# Patient Record
Sex: Female | Born: 1960 | Race: Black or African American | Hispanic: No | Marital: Single | Smoking: Never smoker
Health system: Southern US, Community
[De-identification: ages and names within clinical notes are randomized; demographics above are authoritative.]

## PROBLEM LIST (undated history)

## (undated) HISTORY — PX: BREAST EXCISIONAL BIOPSY: SUR124

---

## 2009-09-15 ENCOUNTER — Ambulatory Visit (HOSPITAL_COMMUNITY): Admission: RE | Admit: 2009-09-15 | Discharge: 2009-09-15 | Payer: Self-pay | Admitting: Internal Medicine

## 2010-08-24 ENCOUNTER — Other Ambulatory Visit (HOSPITAL_COMMUNITY): Payer: Self-pay | Admitting: Internal Medicine

## 2010-08-24 DIAGNOSIS — Z1231 Encounter for screening mammogram for malignant neoplasm of breast: Secondary | ICD-10-CM

## 2010-09-20 ENCOUNTER — Ambulatory Visit (HOSPITAL_COMMUNITY)
Admission: RE | Admit: 2010-09-20 | Discharge: 2010-09-20 | Disposition: A | Discharge status: Home / Self Care | Payer: BC Managed Care – PPO | Source: Ambulatory Visit | Attending: Internal Medicine | Admitting: Internal Medicine

## 2010-09-20 DIAGNOSIS — Z1231 Encounter for screening mammogram for malignant neoplasm of breast: Secondary | ICD-10-CM | POA: Insufficient documentation

## 2010-09-30 ENCOUNTER — Other Ambulatory Visit (HOSPITAL_COMMUNITY)
Admission: RE | Admit: 2010-09-30 | Discharge: 2010-09-30 | Disposition: A | Discharge status: Home / Self Care | Payer: BC Managed Care – PPO | Source: Ambulatory Visit | Attending: Internal Medicine | Admitting: Internal Medicine

## 2010-09-30 ENCOUNTER — Other Ambulatory Visit: Payer: Self-pay | Admitting: Internal Medicine

## 2010-09-30 DIAGNOSIS — Z01419 Encounter for gynecological examination (general) (routine) without abnormal findings: Secondary | ICD-10-CM | POA: Insufficient documentation

## 2011-08-02 ENCOUNTER — Other Ambulatory Visit: Payer: Self-pay | Admitting: Physician Assistant

## 2011-08-02 DIAGNOSIS — D229 Melanocytic nevi, unspecified: Secondary | ICD-10-CM

## 2011-08-02 HISTORY — DX: Melanocytic nevi, unspecified: D22.9

## 2012-09-14 ENCOUNTER — Ambulatory Visit
Admission: RE | Admit: 2012-09-14 | Discharge: 2012-09-14 | Disposition: A | Discharge status: Home / Self Care | Payer: BC Managed Care – PPO | Source: Ambulatory Visit | Attending: Internal Medicine | Admitting: Internal Medicine

## 2012-09-14 ENCOUNTER — Other Ambulatory Visit: Payer: Self-pay | Admitting: Internal Medicine

## 2012-09-14 DIAGNOSIS — M533 Sacrococcygeal disorders, not elsewhere classified: Secondary | ICD-10-CM

## 2012-10-29 ENCOUNTER — Other Ambulatory Visit (HOSPITAL_COMMUNITY): Payer: Self-pay | Admitting: Internal Medicine

## 2012-10-29 DIAGNOSIS — Z1231 Encounter for screening mammogram for malignant neoplasm of breast: Secondary | ICD-10-CM

## 2012-11-13 ENCOUNTER — Ambulatory Visit (HOSPITAL_COMMUNITY)
Admission: RE | Admit: 2012-11-13 | Discharge: 2012-11-13 | Disposition: A | Discharge status: Home / Self Care | Payer: BC Managed Care – PPO | Source: Ambulatory Visit | Attending: Internal Medicine | Admitting: Internal Medicine

## 2012-11-13 DIAGNOSIS — Z1231 Encounter for screening mammogram for malignant neoplasm of breast: Secondary | ICD-10-CM | POA: Insufficient documentation

## 2014-12-10 ENCOUNTER — Other Ambulatory Visit (HOSPITAL_COMMUNITY): Payer: Self-pay | Admitting: Internal Medicine

## 2014-12-10 DIAGNOSIS — Z1231 Encounter for screening mammogram for malignant neoplasm of breast: Secondary | ICD-10-CM

## 2014-12-23 ENCOUNTER — Other Ambulatory Visit (HOSPITAL_COMMUNITY): Payer: Self-pay | Admitting: Internal Medicine

## 2014-12-23 ENCOUNTER — Ambulatory Visit (HOSPITAL_COMMUNITY)
Admission: RE | Admit: 2014-12-23 | Discharge: 2014-12-23 | Disposition: A | Discharge status: Home / Self Care | Payer: 59 | Source: Ambulatory Visit | Attending: Internal Medicine | Admitting: Internal Medicine

## 2014-12-23 DIAGNOSIS — Z1231 Encounter for screening mammogram for malignant neoplasm of breast: Secondary | ICD-10-CM | POA: Diagnosis not present

## 2015-01-30 ENCOUNTER — Other Ambulatory Visit: Payer: Self-pay | Admitting: Physician Assistant

## 2016-02-16 ENCOUNTER — Other Ambulatory Visit: Payer: Self-pay | Admitting: Internal Medicine

## 2016-02-16 DIAGNOSIS — Z1231 Encounter for screening mammogram for malignant neoplasm of breast: Secondary | ICD-10-CM

## 2016-02-24 ENCOUNTER — Ambulatory Visit
Admission: RE | Admit: 2016-02-24 | Discharge: 2016-02-24 | Disposition: A | Discharge status: Home / Self Care | Payer: 59 | Source: Ambulatory Visit | Attending: Internal Medicine | Admitting: Internal Medicine

## 2016-02-24 ENCOUNTER — Other Ambulatory Visit: Payer: Self-pay | Admitting: Internal Medicine

## 2016-02-24 DIAGNOSIS — Z1231 Encounter for screening mammogram for malignant neoplasm of breast: Secondary | ICD-10-CM

## 2019-03-29 ENCOUNTER — Other Ambulatory Visit: Payer: Self-pay | Admitting: Internal Medicine

## 2019-03-29 DIAGNOSIS — Z1231 Encounter for screening mammogram for malignant neoplasm of breast: Secondary | ICD-10-CM

## 2019-05-22 ENCOUNTER — Ambulatory Visit
Admission: RE | Admit: 2019-05-22 | Discharge: 2019-05-22 | Disposition: A | Discharge status: Home / Self Care | Payer: 59 | Source: Ambulatory Visit | Attending: Internal Medicine | Admitting: Internal Medicine

## 2019-05-22 ENCOUNTER — Other Ambulatory Visit: Payer: Self-pay

## 2019-05-22 DIAGNOSIS — Z1231 Encounter for screening mammogram for malignant neoplasm of breast: Secondary | ICD-10-CM

## 2020-03-24 ENCOUNTER — Other Ambulatory Visit: Payer: Self-pay | Admitting: Internal Medicine

## 2020-03-24 DIAGNOSIS — Z1231 Encounter for screening mammogram for malignant neoplasm of breast: Secondary | ICD-10-CM

## 2020-04-17 ENCOUNTER — Other Ambulatory Visit: Payer: Self-pay | Admitting: Internal Medicine

## 2020-04-17 DIAGNOSIS — R102 Pelvic and perineal pain: Secondary | ICD-10-CM

## 2020-04-20 ENCOUNTER — Ambulatory Visit
Admission: RE | Admit: 2020-04-20 | Discharge: 2020-04-20 | Disposition: A | Discharge status: Home / Self Care | Payer: 59 | Source: Ambulatory Visit | Attending: Internal Medicine | Admitting: Internal Medicine

## 2020-04-20 DIAGNOSIS — R102 Pelvic and perineal pain: Secondary | ICD-10-CM

## 2020-05-25 ENCOUNTER — Ambulatory Visit
Admission: RE | Admit: 2020-05-25 | Discharge: 2020-05-25 | Disposition: A | Discharge status: Home / Self Care | Payer: 59 | Source: Ambulatory Visit | Attending: Internal Medicine | Admitting: Internal Medicine

## 2020-05-25 ENCOUNTER — Other Ambulatory Visit: Payer: Self-pay

## 2020-05-25 DIAGNOSIS — Z1231 Encounter for screening mammogram for malignant neoplasm of breast: Secondary | ICD-10-CM

## 2020-09-01 ENCOUNTER — Ambulatory Visit (INDEPENDENT_AMBULATORY_CARE_PROVIDER_SITE_OTHER): Payer: 59 | Admitting: Physician Assistant

## 2020-09-01 ENCOUNTER — Encounter: Payer: Self-pay | Admitting: Physician Assistant

## 2020-09-01 ENCOUNTER — Other Ambulatory Visit: Payer: Self-pay

## 2020-09-01 DIAGNOSIS — D239 Other benign neoplasm of skin, unspecified: Secondary | ICD-10-CM

## 2020-09-01 DIAGNOSIS — D2361 Other benign neoplasm of skin of right upper limb, including shoulder: Secondary | ICD-10-CM

## 2020-09-01 DIAGNOSIS — Z1283 Encounter for screening for malignant neoplasm of skin: Secondary | ICD-10-CM | POA: Diagnosis not present

## 2020-09-01 NOTE — Progress Notes (Signed)
   Follow-Up Visit   Subjective  Jenny Villarreal is an AA,  60 y.o. female who presents for the following: Annual Exam (Here for full body skin examination- no new concerns. No known history of melanoma or non mole skin cancers. 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   The following portions of the chart were reviewed this encounter and updated as appropriate:  Tobacco  Allergies  Meds  Problems  Med Hx  Surg Hx  Fam Hx      Objective  Well appearing patient in no apparent distress; mood and affect are within normal limits.  A full examination was performed including scalp, head, eyes, ears, nose, lips, neck, chest, axillae, abdomen, back, buttocks, bilateral upper extremities, bilateral lower extremities, hands, feet, fingers, toes, fingernails, and toenails. All findings within normal limits unless otherwise noted below.  Objective  Left Upper Arm - Anterior: Dark dense nodule.   Assessment & Plan  Dermatofibroma Left Upper Arm - Anterior  observe    Skin cancer screening performed today.    I, Tine Mabee, PA-C, have reviewed all documentation's for this visit.  The documentation on 09/01/20 for the exam, diagnosis, procedures and orders are all accurate and complete.

## 2020-10-23 ENCOUNTER — Other Ambulatory Visit: Payer: Self-pay | Admitting: Internal Medicine

## 2020-10-23 DIAGNOSIS — E785 Hyperlipidemia, unspecified: Secondary | ICD-10-CM

## 2020-11-11 ENCOUNTER — Ambulatory Visit (HOSPITAL_COMMUNITY)
Admission: RE | Admit: 2020-11-11 | Discharge: 2020-11-11 | Disposition: A | Discharge status: Home / Self Care | Payer: 59 | Source: Ambulatory Visit | Attending: Internal Medicine | Admitting: Internal Medicine

## 2020-11-11 ENCOUNTER — Other Ambulatory Visit: Payer: Self-pay

## 2020-11-11 DIAGNOSIS — E785 Hyperlipidemia, unspecified: Secondary | ICD-10-CM | POA: Insufficient documentation

## 2021-04-01 ENCOUNTER — Other Ambulatory Visit: Payer: Self-pay | Admitting: Internal Medicine

## 2021-04-01 DIAGNOSIS — Z1231 Encounter for screening mammogram for malignant neoplasm of breast: Secondary | ICD-10-CM

## 2021-05-27 ENCOUNTER — Ambulatory Visit: Payer: 59

## 2021-06-16 ENCOUNTER — Ambulatory Visit
Admission: RE | Admit: 2021-06-16 | Discharge: 2021-06-16 | Disposition: A | Discharge status: Home / Self Care | Payer: PRIVATE HEALTH INSURANCE | Source: Ambulatory Visit | Attending: Internal Medicine | Admitting: Internal Medicine

## 2021-06-16 DIAGNOSIS — Z1231 Encounter for screening mammogram for malignant neoplasm of breast: Secondary | ICD-10-CM

## 2021-06-17 ENCOUNTER — Other Ambulatory Visit: Payer: Self-pay | Admitting: Internal Medicine

## 2021-06-17 DIAGNOSIS — R928 Other abnormal and inconclusive findings on diagnostic imaging of breast: Secondary | ICD-10-CM

## 2021-07-03 ENCOUNTER — Other Ambulatory Visit: Payer: Self-pay | Admitting: Internal Medicine

## 2021-07-03 ENCOUNTER — Ambulatory Visit
Admission: RE | Admit: 2021-07-03 | Discharge: 2021-07-03 | Disposition: A | Discharge status: Home / Self Care | Payer: PRIVATE HEALTH INSURANCE | Source: Ambulatory Visit | Attending: Internal Medicine | Admitting: Internal Medicine

## 2021-07-03 DIAGNOSIS — R928 Other abnormal and inconclusive findings on diagnostic imaging of breast: Secondary | ICD-10-CM

## 2021-07-03 DIAGNOSIS — R921 Mammographic calcification found on diagnostic imaging of breast: Secondary | ICD-10-CM

## 2021-07-15 ENCOUNTER — Ambulatory Visit
Admission: RE | Admit: 2021-07-15 | Discharge: 2021-07-15 | Disposition: A | Discharge status: Home / Self Care | Payer: PRIVATE HEALTH INSURANCE | Source: Ambulatory Visit | Attending: Internal Medicine | Admitting: Internal Medicine

## 2021-07-15 ENCOUNTER — Other Ambulatory Visit: Payer: Self-pay | Admitting: Internal Medicine

## 2021-07-15 DIAGNOSIS — R921 Mammographic calcification found on diagnostic imaging of breast: Secondary | ICD-10-CM

## 2021-07-15 HISTORY — PX: BREAST BIOPSY: SHX20

## 2021-09-01 ENCOUNTER — Ambulatory Visit: Payer: 59 | Admitting: Physician Assistant

## 2021-09-14 ENCOUNTER — Ambulatory Visit (INDEPENDENT_AMBULATORY_CARE_PROVIDER_SITE_OTHER): Payer: PRIVATE HEALTH INSURANCE | Admitting: Physician Assistant

## 2021-09-14 ENCOUNTER — Encounter: Payer: Self-pay | Admitting: Physician Assistant

## 2021-09-14 DIAGNOSIS — Z86018 Personal history of other benign neoplasm: Secondary | ICD-10-CM

## 2021-09-14 DIAGNOSIS — Z1283 Encounter for screening for malignant neoplasm of skin: Secondary | ICD-10-CM

## 2021-09-14 DIAGNOSIS — L820 Inflamed seborrheic keratosis: Secondary | ICD-10-CM

## 2021-09-14 NOTE — Progress Notes (Signed)
? ?  Follow-Up Visit ?  ?Subjective  ?Jenny Villarreal is a 61 y.o. female who presents for the following: Annual Exam (Patient here today for yearly skin check. Per patient she has a few lesions on her right upper arm that she would like checked x unsure no bleeding, no pain, no itching. Patient would also like lesions on her neck that she would like removed x years no bleeding, no pain, no itching. Personal history of atypical mole. No personal history or family history of melanoma or non mole skin cancer. ). ? ? ?The following portions of the chart were reviewed this encounter and updated as appropriate:  Tobacco  Allergies  Meds  Problems  Med Hx  Surg Hx  Fam Hx   ?  ? ?Objective  ?Well appearing patient in no apparent distress; mood and affect are within normal limits. ? ?A full examination was performed including scalp, head, eyes, ears, nose, lips, neck, chest, axillae, abdomen, back, buttocks, bilateral upper extremities, bilateral lower extremities, hands, feet, fingers, toes, fingernails, and toenails. All findings within normal limits unless otherwise noted below. ? ?Neck - Anterior (10) ?Stuck-on crusted plaques.  ? ? ?Assessment & Plan  ?Inflamed seborrheic keratosis (10) ?Neck - Anterior ? ?Destruction of lesion - Neck - Anterior ?Complexity: simple   ?Destruction method: electrodesiccation and curettage   ?Informed consent: discussed and consent obtained   ?Timeout:  patient name, date of birth, surgical site, and procedure verified ?  Curettage performed in three different directions: No   ?Electrodesiccation performed over the curetted area: Yes   ?Hemostasis achieved with:  aluminum chloride and electrodesiccation ?Outcome: patient tolerated procedure well with no complications   ?Post-procedure details: wound care instructions given   ? ? ? ?I, Jeweliana Dudgeon, PA-C, have reviewed all documentation's for this visit.  The documentation on 09/14/21 for the exam, diagnosis, procedures and  orders are all accurate and complete. ?

## 2022-01-10 ENCOUNTER — Other Ambulatory Visit: Payer: Self-pay | Admitting: General Surgery

## 2022-01-10 DIAGNOSIS — Z803 Family history of malignant neoplasm of breast: Secondary | ICD-10-CM

## 2022-01-10 DIAGNOSIS — Z1239 Encounter for other screening for malignant neoplasm of breast: Secondary | ICD-10-CM

## 2022-01-24 ENCOUNTER — Ambulatory Visit
Admission: RE | Admit: 2022-01-24 | Discharge: 2022-01-24 | Disposition: A | Discharge status: Home / Self Care | Payer: PRIVATE HEALTH INSURANCE | Source: Ambulatory Visit | Attending: General Surgery | Admitting: General Surgery

## 2022-01-24 DIAGNOSIS — Z1239 Encounter for other screening for malignant neoplasm of breast: Secondary | ICD-10-CM

## 2022-01-24 DIAGNOSIS — Z803 Family history of malignant neoplasm of breast: Secondary | ICD-10-CM

## 2022-01-24 MED ORDER — GADOBUTROL 1 MMOL/ML IV SOLN
9.0000 mL | Freq: Once | INTRAVENOUS | Status: AC | PRN
  Administered 2022-01-24: 9 mL via INTRAVENOUS

## 2022-01-26 ENCOUNTER — Other Ambulatory Visit: Payer: Self-pay | Admitting: Internal Medicine

## 2022-01-26 ENCOUNTER — Ambulatory Visit
Admission: RE | Admit: 2022-01-26 | Discharge: 2022-01-26 | Disposition: A | Discharge status: Home / Self Care | Payer: PRIVATE HEALTH INSURANCE | Source: Ambulatory Visit | Attending: Internal Medicine | Admitting: Internal Medicine

## 2022-01-26 DIAGNOSIS — R0600 Dyspnea, unspecified: Secondary | ICD-10-CM

## 2022-05-18 ENCOUNTER — Other Ambulatory Visit: Payer: Self-pay | Admitting: Internal Medicine

## 2022-05-18 DIAGNOSIS — Z1231 Encounter for screening mammogram for malignant neoplasm of breast: Secondary | ICD-10-CM

## 2022-06-07 ENCOUNTER — Other Ambulatory Visit: Payer: Self-pay | Admitting: Internal Medicine

## 2022-06-07 DIAGNOSIS — E2839 Other primary ovarian failure: Secondary | ICD-10-CM

## 2022-07-12 ENCOUNTER — Ambulatory Visit
Admission: RE | Admit: 2022-07-12 | Discharge: 2022-07-12 | Disposition: A | Discharge status: Home / Self Care | Payer: PRIVATE HEALTH INSURANCE | Source: Ambulatory Visit | Attending: Internal Medicine | Admitting: Internal Medicine

## 2022-07-12 DIAGNOSIS — Z1231 Encounter for screening mammogram for malignant neoplasm of breast: Secondary | ICD-10-CM

## 2022-07-14 ENCOUNTER — Ambulatory Visit
Admission: RE | Admit: 2022-07-14 | Discharge: 2022-07-14 | Disposition: A | Discharge status: Home / Self Care | Payer: PRIVATE HEALTH INSURANCE | Source: Ambulatory Visit | Attending: Internal Medicine | Admitting: Internal Medicine

## 2022-07-14 DIAGNOSIS — E2839 Other primary ovarian failure: Secondary | ICD-10-CM

## 2022-09-15 ENCOUNTER — Ambulatory Visit: Payer: PRIVATE HEALTH INSURANCE | Admitting: Physician Assistant

## 2023-01-06 ENCOUNTER — Other Ambulatory Visit: Payer: Self-pay | Admitting: General Surgery

## 2023-01-06 DIAGNOSIS — Z803 Family history of malignant neoplasm of breast: Secondary | ICD-10-CM

## 2023-01-06 DIAGNOSIS — Z1239 Encounter for other screening for malignant neoplasm of breast: Secondary | ICD-10-CM

## 2023-02-18 ENCOUNTER — Ambulatory Visit
Admission: RE | Admit: 2023-02-18 | Discharge: 2023-02-18 | Disposition: A | Discharge status: Home / Self Care | Payer: PRIVATE HEALTH INSURANCE | Source: Ambulatory Visit | Attending: General Surgery | Admitting: General Surgery

## 2023-02-18 DIAGNOSIS — Z1239 Encounter for other screening for malignant neoplasm of breast: Secondary | ICD-10-CM

## 2023-02-18 DIAGNOSIS — Z803 Family history of malignant neoplasm of breast: Secondary | ICD-10-CM

## 2023-02-18 MED ORDER — GADOPICLENOL 0.5 MMOL/ML IV SOLN
9.0000 mL | Freq: Once | INTRAVENOUS | Status: AC | PRN
  Administered 2023-02-18: 9 mL via INTRAVENOUS

## 2023-06-07 ENCOUNTER — Other Ambulatory Visit: Payer: Self-pay | Admitting: Internal Medicine

## 2023-06-07 DIAGNOSIS — Z1231 Encounter for screening mammogram for malignant neoplasm of breast: Secondary | ICD-10-CM

## 2023-06-21 ENCOUNTER — Other Ambulatory Visit: Payer: Self-pay | Admitting: Internal Medicine

## 2023-06-21 DIAGNOSIS — E2839 Other primary ovarian failure: Secondary | ICD-10-CM

## 2023-07-18 ENCOUNTER — Ambulatory Visit
Admission: RE | Admit: 2023-07-18 | Discharge: 2023-07-18 | Disposition: A | Discharge status: Home / Self Care | Payer: PRIVATE HEALTH INSURANCE | Source: Ambulatory Visit | Attending: Internal Medicine | Admitting: Internal Medicine

## 2023-07-18 DIAGNOSIS — Z1231 Encounter for screening mammogram for malignant neoplasm of breast: Secondary | ICD-10-CM

## 2023-07-20 IMAGING — MG MM BREAST BX W/ LOC DEV 1ST LESION IMAGE BX SPEC STEREO GUIDE*R*
7 of 12 series · 7 of 20 positions shown · non-contrast
Comparison: Previous exams.
COMPARISON: Previous exams.

Addendum:
CLINICAL DATA: Screening detected indeterminate 3 mm group
calcifications involving the inner RIGHT breast at middle depth,
localizing to the LOWER INNER QUADRANT.

EXAM:
RIGHT BREAST STEREOTACTIC CORE NEEDLE BIOPSY

[R (1 of 7)]
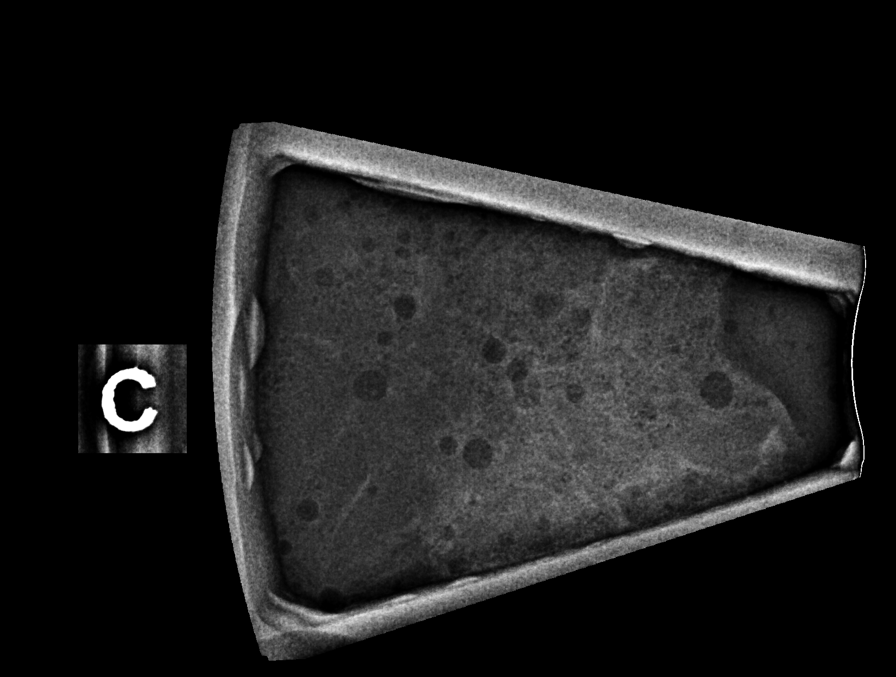

[R (2 of 7)]
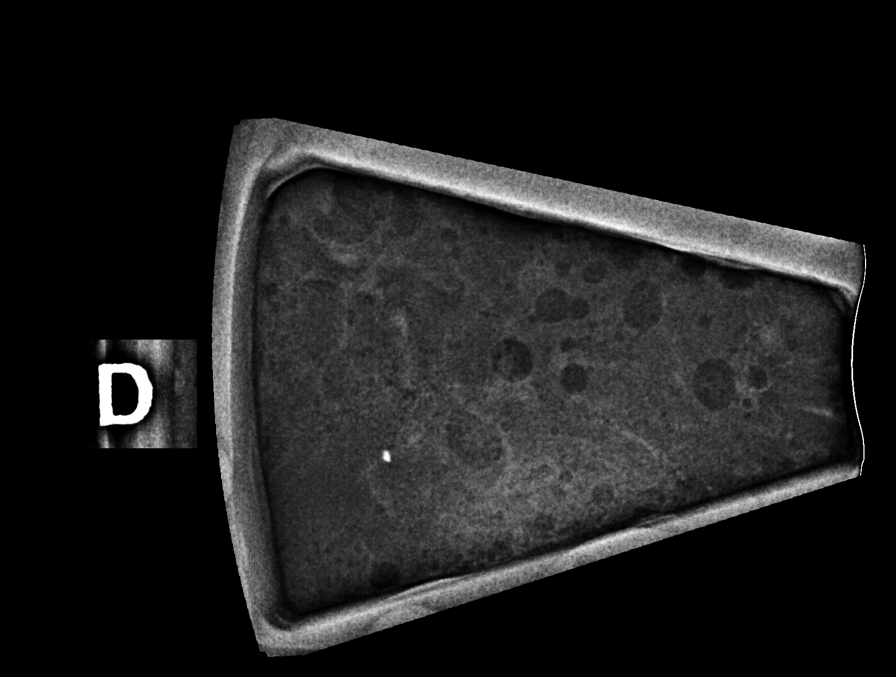

[R (3 of 7)]
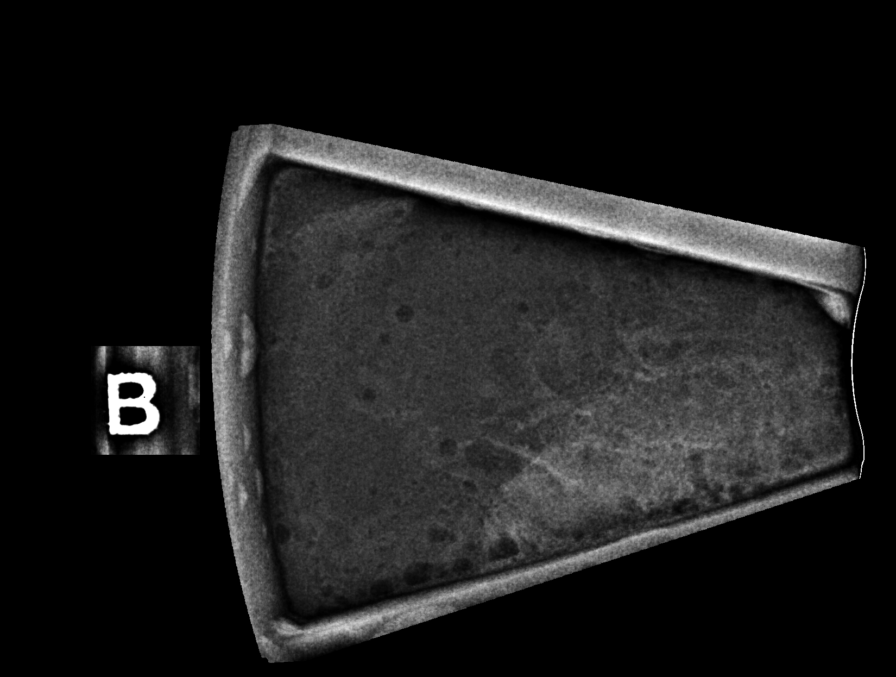

[R (4 of 7)]
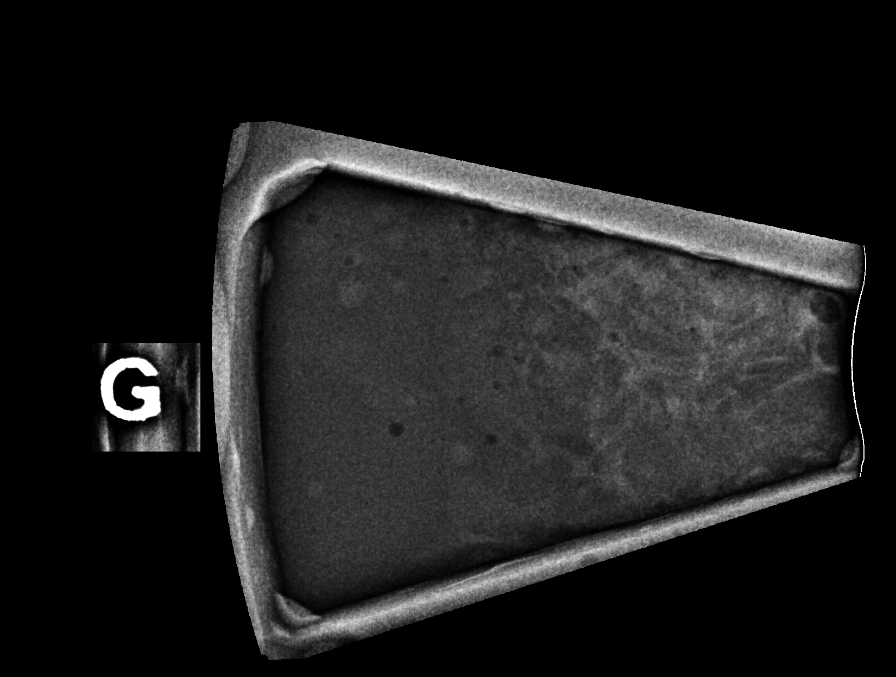

[R (5 of 7)]
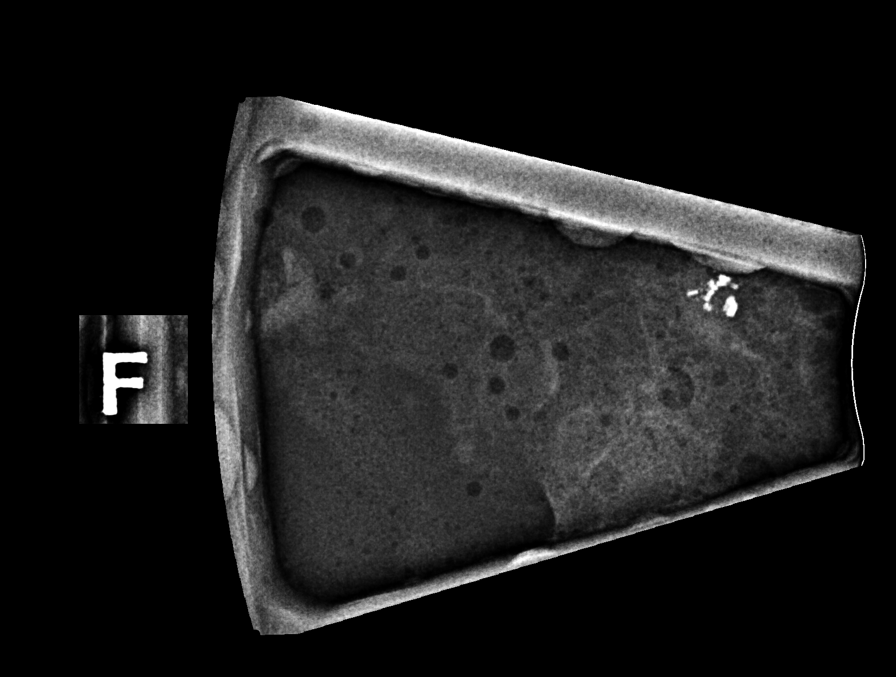

[R (6 of 7)]
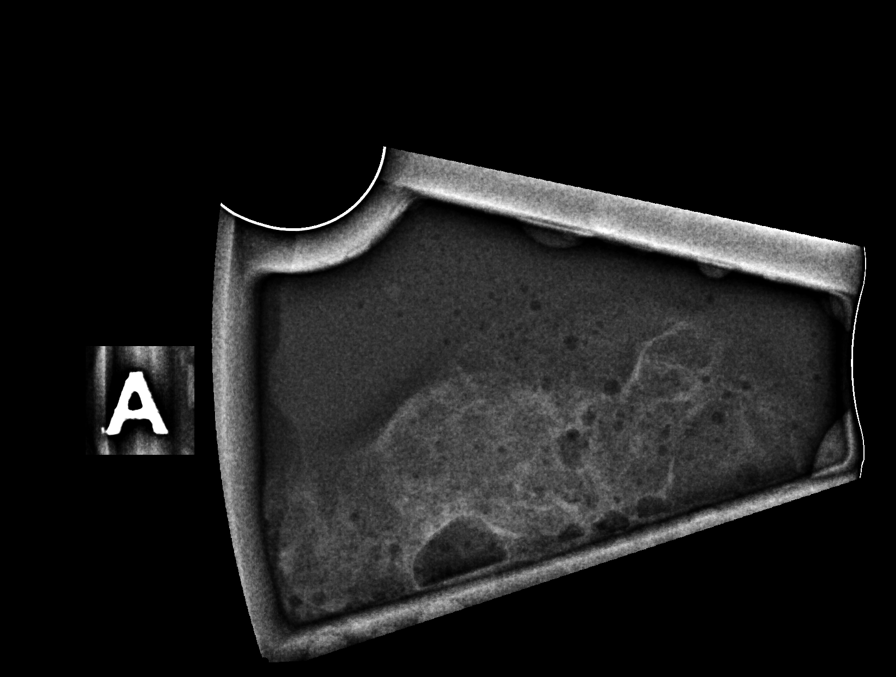

[R (7 of 7)]
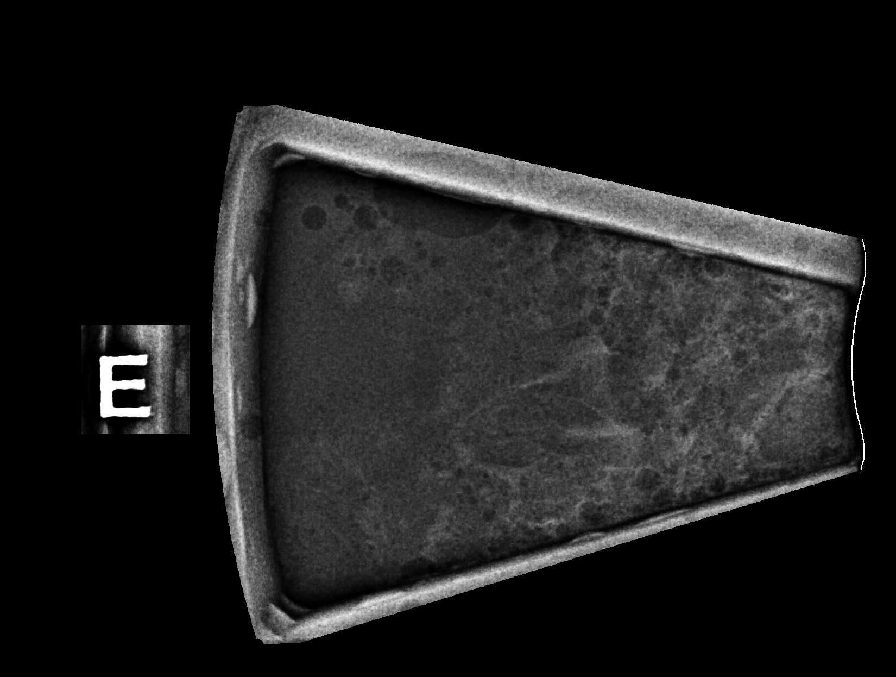

[7 of 20 positions shown; findings below may reference images not displayed]



Lesion quadrant: LOWER INNER QUADRANT.

Using sterile technique with chlorhexidine as skin antisepsis, 1%
lidocaine and 1% lidocaine with epinephrine as local anesthetic,
under stereotactic tomosynthesis guidance, a 9 gauge Brevera vacuum
assisted device was used to perform core needle biopsy of
calcifications involving the inner breast using a superior approach.
Specimen radiograph was performed showing calcifications in 2 of the
core samples. Specimens with calcifications are identified for
pathology. At the conclusion of the procedure, a coil shaped tissue
marker clip was deployed into the biopsy cavity.

Follow-up full field 2D and 3D CC and mediolateral mammographic
images were obtained to confirm clip placement. The coil shaped
tissue marking clip is appropriately positioned at the site of the
biopsied calcifications in the LOWER INNER QUADRANT at middle depth.
All of the calcifications were removed with the biopsy. Expected
post biopsy changes are present without evidence of hematoma.
IMPRESSION: 1. Stereotactic tomosynthesis core needle biopsy of a 3 mm group of
calcifications involving the inner RIGHT breast at middle depth,
localizing to the LOWER INNER QUADRANT. No apparent complications.
2. Appropriate positioning of the coil shaped tissue marker clip at
the site of the biopsied calcifications. All of the calcifications
were removed with the biopsy.

ADDENDUM:
Pathology revealed SCLEROTIC FIBROADENOMATOID NODULE WITH DYSTROPHIC
CALCIFICATIONS- NO MALIGNANCY IDENTIFIED of the RIGHT breast, inner
(coil clip). This was found to be concordant by Dr. Beljean Sordar.

Pathology results were discussed with the patient by telephone. The
patient reported doing well after the biopsy with tenderness at the
site. Post biopsy instructions and care were reviewed and questions
were answered. The patient was encouraged to call The [REDACTED]

The patient was instructed to return for annual screening
mammography and was informed a reminder notice would be sent
regarding this appointment.

Pathology results reported by Sangbeom Interiano RN on 07/16/2021.



Lesion quadrant: LOWER INNER QUADRANT.

Using sterile technique with chlorhexidine as skin antisepsis, 1%
lidocaine and 1% lidocaine with epinephrine as local anesthetic,
under stereotactic tomosynthesis guidance, a 9 gauge Brevera vacuum
assisted device was used to perform core needle biopsy of
calcifications involving the inner breast using a superior approach.
Specimen radiograph was performed showing calcifications in 2 of the
core samples. Specimens with calcifications are identified for
pathology. At the conclusion of the procedure, a coil shaped tissue
marker clip was deployed into the biopsy cavity.

Follow-up full field 2D and 3D CC and mediolateral mammographic
images were obtained to confirm clip placement. The coil shaped
tissue marking clip is appropriately positioned at the site of the
biopsied calcifications in the LOWER INNER QUADRANT at middle depth.
All of the calcifications were removed with the biopsy. Expected
post biopsy changes are present without evidence of hematoma.
IMPRESSION: 1. Stereotactic tomosynthesis core needle biopsy of a 3 mm group of
calcifications involving the inner RIGHT breast at middle depth,
localizing to the LOWER INNER QUADRANT. No apparent complications.
2. Appropriate positioning of the coil shaped tissue marker clip at
the site of the biopsied calcifications. All of the calcifications
were removed with the biopsy.

## 2023-12-05 ENCOUNTER — Other Ambulatory Visit: Payer: Self-pay | Admitting: General Surgery

## 2023-12-05 DIAGNOSIS — Z803 Family history of malignant neoplasm of breast: Secondary | ICD-10-CM

## 2023-12-30 ENCOUNTER — Ambulatory Visit
Admission: RE | Admit: 2023-12-30 | Discharge: 2023-12-30 | Disposition: A | Discharge status: Home / Self Care | Payer: PRIVATE HEALTH INSURANCE | Source: Ambulatory Visit | Attending: General Surgery | Admitting: General Surgery

## 2023-12-30 DIAGNOSIS — Z803 Family history of malignant neoplasm of breast: Secondary | ICD-10-CM

## 2023-12-30 MED ORDER — GADOPICLENOL 0.5 MMOL/ML IV SOLN
9.0000 mL | Freq: Once | INTRAVENOUS | Status: AC | PRN
  Administered 2023-12-30: 9 mL via INTRAVENOUS

## 2024-02-23 ENCOUNTER — Other Ambulatory Visit: Payer: PRIVATE HEALTH INSURANCE

## 2024-04-08 ENCOUNTER — Ambulatory Visit (HOSPITAL_BASED_OUTPATIENT_CLINIC_OR_DEPARTMENT_OTHER)
Admission: RE | Admit: 2024-04-08 | Discharge: 2024-04-08 | Disposition: A | Discharge status: Home / Self Care | Payer: PRIVATE HEALTH INSURANCE | Source: Ambulatory Visit | Attending: Internal Medicine | Admitting: Internal Medicine

## 2024-04-08 DIAGNOSIS — E2839 Other primary ovarian failure: Secondary | ICD-10-CM | POA: Insufficient documentation

## 2024-06-11 ENCOUNTER — Other Ambulatory Visit: Payer: Self-pay | Admitting: General Surgery

## 2024-06-11 DIAGNOSIS — Z1231 Encounter for screening mammogram for malignant neoplasm of breast: Secondary | ICD-10-CM

## 2024-07-23 ENCOUNTER — Ambulatory Visit: Payer: PRIVATE HEALTH INSURANCE
